# Patient Record
Sex: Male | Born: 1970
Health system: Southern US, Community
[De-identification: ages and names within clinical notes are randomized; demographics above are authoritative.]

---

## 2017-03-22 DIAGNOSIS — J028 Acute pharyngitis due to other specified organisms: Secondary | ICD-10-CM | POA: Diagnosis not present

## 2017-03-22 DIAGNOSIS — B9789 Other viral agents as the cause of diseases classified elsewhere: Secondary | ICD-10-CM | POA: Diagnosis not present

## 2017-03-22 DIAGNOSIS — Z23 Encounter for immunization: Secondary | ICD-10-CM | POA: Diagnosis not present

## 2017-05-22 DIAGNOSIS — Z6822 Body mass index (BMI) 22.0-22.9, adult: Secondary | ICD-10-CM | POA: Diagnosis not present

## 2017-05-22 DIAGNOSIS — E069 Thyroiditis, unspecified: Secondary | ICD-10-CM | POA: Diagnosis not present

## 2017-05-22 DIAGNOSIS — Z1389 Encounter for screening for other disorder: Secondary | ICD-10-CM | POA: Diagnosis not present

## 2017-08-14 DIAGNOSIS — E069 Thyroiditis, unspecified: Secondary | ICD-10-CM | POA: Diagnosis not present

## 2017-08-14 DIAGNOSIS — Z125 Encounter for screening for malignant neoplasm of prostate: Secondary | ICD-10-CM | POA: Diagnosis not present

## 2017-08-14 DIAGNOSIS — Z0000 Encounter for general adult medical examination without abnormal findings: Secondary | ICD-10-CM | POA: Diagnosis not present

## 2017-08-16 DIAGNOSIS — Z1389 Encounter for screening for other disorder: Secondary | ICD-10-CM | POA: Diagnosis not present

## 2017-08-16 DIAGNOSIS — Z0000 Encounter for general adult medical examination without abnormal findings: Secondary | ICD-10-CM | POA: Diagnosis not present

## 2017-08-22 DIAGNOSIS — Z1212 Encounter for screening for malignant neoplasm of rectum: Secondary | ICD-10-CM | POA: Diagnosis not present

## 2018-08-15 DIAGNOSIS — Z125 Encounter for screening for malignant neoplasm of prostate: Secondary | ICD-10-CM | POA: Diagnosis not present

## 2018-08-15 DIAGNOSIS — E069 Thyroiditis, unspecified: Secondary | ICD-10-CM | POA: Diagnosis not present

## 2018-08-17 DIAGNOSIS — E786 Lipoprotein deficiency: Secondary | ICD-10-CM | POA: Diagnosis not present

## 2018-08-17 DIAGNOSIS — Z1331 Encounter for screening for depression: Secondary | ICD-10-CM | POA: Diagnosis not present

## 2018-08-17 DIAGNOSIS — Z0000 Encounter for general adult medical examination without abnormal findings: Secondary | ICD-10-CM | POA: Diagnosis not present

## 2019-01-25 DIAGNOSIS — Z23 Encounter for immunization: Secondary | ICD-10-CM | POA: Diagnosis not present

## 2019-06-03 DIAGNOSIS — Z03818 Encounter for observation for suspected exposure to other biological agents ruled out: Secondary | ICD-10-CM | POA: Diagnosis not present

## 2019-07-11 DIAGNOSIS — M79601 Pain in right arm: Secondary | ICD-10-CM | POA: Diagnosis not present

## 2019-08-23 DIAGNOSIS — Z0000 Encounter for general adult medical examination without abnormal findings: Secondary | ICD-10-CM | POA: Diagnosis not present

## 2019-08-23 DIAGNOSIS — Z125 Encounter for screening for malignant neoplasm of prostate: Secondary | ICD-10-CM | POA: Diagnosis not present

## 2019-08-23 DIAGNOSIS — E786 Lipoprotein deficiency: Secondary | ICD-10-CM | POA: Diagnosis not present

## 2019-08-23 DIAGNOSIS — E069 Thyroiditis, unspecified: Secondary | ICD-10-CM | POA: Diagnosis not present

## 2019-08-28 DIAGNOSIS — Z0000 Encounter for general adult medical examination without abnormal findings: Secondary | ICD-10-CM | POA: Diagnosis not present

## 2019-08-28 DIAGNOSIS — Z1331 Encounter for screening for depression: Secondary | ICD-10-CM | POA: Diagnosis not present

## 2019-09-28 DIAGNOSIS — Z20828 Contact with and (suspected) exposure to other viral communicable diseases: Secondary | ICD-10-CM | POA: Diagnosis not present

## 2019-09-28 DIAGNOSIS — Z03818 Encounter for observation for suspected exposure to other biological agents ruled out: Secondary | ICD-10-CM | POA: Diagnosis not present

## 2019-10-09 DIAGNOSIS — M79601 Pain in right arm: Secondary | ICD-10-CM | POA: Diagnosis not present

## 2019-10-09 DIAGNOSIS — N509 Disorder of male genital organs, unspecified: Secondary | ICD-10-CM | POA: Diagnosis not present

## 2019-10-09 DIAGNOSIS — Z1212 Encounter for screening for malignant neoplasm of rectum: Secondary | ICD-10-CM | POA: Diagnosis not present

## 2019-10-15 ENCOUNTER — Other Ambulatory Visit: Payer: Self-pay | Admitting: Internal Medicine

## 2019-10-15 DIAGNOSIS — M79601 Pain in right arm: Secondary | ICD-10-CM

## 2019-10-15 DIAGNOSIS — N509 Disorder of male genital organs, unspecified: Secondary | ICD-10-CM

## 2019-10-23 ENCOUNTER — Other Ambulatory Visit: Payer: Self-pay

## 2019-10-24 ENCOUNTER — Ambulatory Visit
Admission: RE | Admit: 2019-10-24 | Discharge: 2019-10-24 | Disposition: A | Discharge status: Home / Self Care | Payer: BC Managed Care – PPO | Source: Ambulatory Visit | Attending: Internal Medicine | Admitting: Internal Medicine

## 2019-10-24 DIAGNOSIS — N503 Cyst of epididymis: Secondary | ICD-10-CM | POA: Diagnosis not present

## 2019-10-24 DIAGNOSIS — M79601 Pain in right arm: Secondary | ICD-10-CM | POA: Diagnosis not present

## 2019-10-24 DIAGNOSIS — N509 Disorder of male genital organs, unspecified: Secondary | ICD-10-CM

## 2019-10-30 DIAGNOSIS — D2930 Benign neoplasm of unspecified epididymis: Secondary | ICD-10-CM | POA: Diagnosis not present

## 2019-11-01 ENCOUNTER — Other Ambulatory Visit: Payer: Self-pay | Admitting: Internal Medicine

## 2019-11-01 DIAGNOSIS — M79601 Pain in right arm: Secondary | ICD-10-CM

## 2019-12-30 ENCOUNTER — Other Ambulatory Visit: Payer: BC Managed Care – PPO

## 2020-01-03 ENCOUNTER — Ambulatory Visit
Admission: RE | Admit: 2020-01-03 | Discharge: 2020-01-03 | Disposition: A | Discharge status: Home / Self Care | Payer: BC Managed Care – PPO | Source: Ambulatory Visit | Attending: Internal Medicine | Admitting: Internal Medicine

## 2020-01-03 DIAGNOSIS — R2241 Localized swelling, mass and lump, right lower limb: Secondary | ICD-10-CM | POA: Diagnosis not present

## 2020-01-03 DIAGNOSIS — R2231 Localized swelling, mass and lump, right upper limb: Secondary | ICD-10-CM | POA: Diagnosis not present

## 2020-01-03 DIAGNOSIS — M79601 Pain in right arm: Secondary | ICD-10-CM

## 2020-01-10 ENCOUNTER — Other Ambulatory Visit: Payer: Self-pay | Admitting: Internal Medicine

## 2020-01-10 DIAGNOSIS — R229 Localized swelling, mass and lump, unspecified: Secondary | ICD-10-CM

## 2020-01-31 DIAGNOSIS — D2930 Benign neoplasm of unspecified epididymis: Secondary | ICD-10-CM | POA: Diagnosis not present

## 2020-02-04 ENCOUNTER — Other Ambulatory Visit: Payer: Self-pay

## 2020-02-04 ENCOUNTER — Ambulatory Visit
Admission: RE | Admit: 2020-02-04 | Discharge: 2020-02-04 | Disposition: A | Discharge status: Home / Self Care | Payer: BC Managed Care – PPO | Source: Ambulatory Visit | Attending: Internal Medicine | Admitting: Internal Medicine

## 2020-02-04 DIAGNOSIS — R2231 Localized swelling, mass and lump, right upper limb: Secondary | ICD-10-CM | POA: Diagnosis not present

## 2020-02-04 DIAGNOSIS — R229 Localized swelling, mass and lump, unspecified: Secondary | ICD-10-CM

## 2020-02-04 MED ORDER — GADOBENATE DIMEGLUMINE 529 MG/ML IV SOLN
15.0000 mL | Freq: Once | INTRAVENOUS | Status: AC | PRN
  Administered 2020-02-04: 15 mL via INTRAVENOUS

## 2020-02-12 DIAGNOSIS — R5383 Other fatigue: Secondary | ICD-10-CM | POA: Diagnosis not present

## 2020-02-12 DIAGNOSIS — M79601 Pain in right arm: Secondary | ICD-10-CM | POA: Diagnosis not present

## 2020-02-13 ENCOUNTER — Telehealth: Payer: Self-pay | Admitting: Internal Medicine

## 2020-02-13 NOTE — Telephone Encounter (Signed)
Received a new pt referral from Dr. 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 Nicholas Beck has been cld and scheduled to see Dr. Mickeal Skinner on 10/4 at 12pm. Pt aware to arrive 15 minutes early.

## 2020-02-17 ENCOUNTER — Inpatient Hospital Stay: Payer: BC Managed Care – PPO | Attending: Internal Medicine | Admitting: Internal Medicine

## 2020-02-17 ENCOUNTER — Other Ambulatory Visit: Payer: Self-pay

## 2020-02-17 DIAGNOSIS — D492 Neoplasm of unspecified behavior of bone, soft tissue, and skin: Secondary | ICD-10-CM | POA: Insufficient documentation

## 2020-02-17 NOTE — Progress Notes (Signed)
[facility] [address], [place] [address] [phone]   New Patient Evaluation  Date of Service: 02/17/20 Patient Name: Lionell Matuszak Patient MRN: [id number] Patient DOB: 04/01/1971 Provider: Ventura Sellers, MD  Identifying Statement:  Jaxtin Raimondo is a 49 y.o. male with right arm nerve sheath tumor who presents for initial consultation and evaluation.    Referring Provider: Velna Hatchet, [address],  [place] [place] [id number]   History of Present Illness: The patient's records from the referring physician were obtained and reviewed and the patient interviewed to confirm this HPI.  Javoris Star presents today to review MRI findings from right arm mass.  He describes 1-2 years history of "noticing small mass on back of right arm".  There has never at any time been any pain, weakness, numbness or other focal symptom associated with the mass.  He underwent several rounds of testing including MRI, which demonstrated likely nerve sheath tumor.  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    Medications: No current outpatient medications on file prior to visit.   No current facility-administered medications on file prior to visit.    Allergies: No Known Allergies Past Medical History: No past medical history on file. Past Surgical History:  Social History:  Social History   Socioeconomic History  . Marital status: Married    Spouse name: Not on file  . Number of children: Not on file  . Years of education: Not on file  . Highest education level: Not on file  Occupational History  . Not on file  Tobacco Use  . Smoking status: Not on file  Substance and Sexual Activity  . Alcohol use: Not on file  . Drug use: Not on file  . Sexual activity: Not on file  Other Topics Concern  . Not on file  Social History Narrative  . Not on file   Social Determinants of  Health   Financial Resource Strain:   . Difficulty of Paying Living Expenses: Not on file  Food Insecurity:   . Worried About Charity fundraiser in the Last Year: Not on file  . Ran Out of Food in the Last Year: Not on file  Transportation Needs:   . Lack of Transportation (Medical): Not on file  . Lack of Transportation (Non-Medical): Not on file  Physical Activity:   . Days of Exercise per Week: Not on file  . Minutes of Exercise per Session: Not on file  Stress:   . Feeling of Stress : Not on file  Social Connections:   . Frequency of Communication with Friends and Family: Not on file  . Frequency of Social Gatherings with Friends and Family: Not on file  . Attends Religious Services: Not on file  . Active Member of Clubs or Organizations: Not on file  . Attends Archivist Meetings: Not on file  . Marital Status: Not on file  Intimate Partner Violence:   . Fear of Current or Ex-Partner: Not on file  . Emotionally Abused: Not on file  . Physically Abused: Not on file  . Sexually Abused: Not on file   Family History: No family history on file.  Review of Systems: Constitutional: Doesn't report fevers, chills or abnormal weight loss Eyes: Doesn't report blurriness of vision Ears, nose, mouth, throat, and face: Doesn't report sore throat Respiratory: Doesn't report cough, dyspnea or wheezes Cardiovascular: Doesn't report palpitation,  chest discomfort  Gastrointestinal:  Doesn't report nausea, constipation, diarrhea GU: Doesn't report incontinence Skin: Doesn't report skin rashes Neurological: Per HPI Musculoskeletal: Doesn't report joint pain Behavioral/Psych: Doesn't report anxiety  Physical Exam: Vitals:   02/17/20 1207  BP: (!) 146/87  Pulse: 63  Resp: 18  Temp: (!) 97.1 F (36.2 C)  SpO2: 100%   KPS: 100. General: Alert, cooperative, pleasant, in no acute distress Head: Normal EENT: No conjunctival injection or scleral icterus.  Lungs: Resp  effort normal Cardiac: Regular rate Abdomen: Non-distended abdomen Skin: No rashes cyanosis or petechiae. Extremities: Small firm nodule deep in right triceps  Neurologic Exam: Mental Status: Awake, alert, attentive to examiner. Oriented to self and environment. Language is fluent with intact comprehension.  Cranial Nerves: Visual acuity is grossly normal. Visual fields are full. Extra-ocular movements intact. No ptosis. Face is symmetric Motor: Tone and bulk are normal. Power is full in both arms and legs. Reflexes are symmetric, no pathologic reflexes present.  Sensory: Intact to light touch Gait: Normal.   Labs: I have reviewed the data as listed No results found for: NA, K, CL, CO2, GLUCOSE, BUN, CREATININE, CALCIUM, PROT, ALBUMIN, AST, ALT, ALKPHOS, BILITOT, GFRNONAA, GFRAA No results found for: WBC, NEUTROABS, HGB, HCT, MCV, PLT  Imaging:  MR SHOULDER RIGHT W WO CONTRAST  Result Date: 02/04/2020 CLINICAL DATA:  Palpable mass of the right upper extremity for 2-3 years painful touch. EXAM: MRI OF THE RIGHT HUMERUS WITHOUT AND WITH CONTRAST TECHNIQUE: Multiplanar, multisequence MR imaging of the right humerus was performed before and after the administration of intravenous contrast. CONTRAST:  1mL MULTIHANCE GADOBENATE DIMEGLUMINE 529 MG/ML IV SOLN COMPARISON:  Ultrasound 01/03/2020 FINDINGS: Bones/Joint/Cartilage No acute fracture. No bone marrow edema. No suspicious marrow replacing lesion. Muscles and Tendons Within the lateral head of the right triceps brachii muscle at the level of the mid to distal humeral diaphysis is a well-circumscribed ovoid T1 isointense, T2 heterogeneously hyperintense mass measuring 10 x 6 x 9 mm (series 8, image 20; series 7, image 7). Lesion enhances on postcontrast sequences with a "target" appearance. There is a split-fat appearance of this lesion on sagittal T1 weighted imaging. No adjacent intramuscular edema. No additional lesions. Remaining muscles of  the upper extremity are normal in bulk and signal intensity. No denervation chest changes within the triceps muscle. Intact tendinous structures. Soft tissues No additional soft tissue abnormality. No right axillary lymphadenopathy. IMPRESSION: Well-circumscribed, enhancing 1.0 cm mass within the lateral head of the right triceps brachii muscle, as described above. 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 Electronically Signed   By: Davina Poke D.O.   On: 02/04/2020 15:52     Assessment/Plan Nerve Sheath Tumor  Alvester Eads presents today with clinical and radiographic syndrome consistent with right radial nerve sheath tumor.  Etiology is either schwannoma or neurofibroma.  He has no sequela of neurofibromatosis.  Because of minimal to no symptom burden, small very likely benign mass, we recommended no intervention at this time.    He should obtain a follow up MRI shoulder scan in 1 year to confirm minimal degree of interval growth.     We appreciate the opportunity to participate in the care of Travas Perdew.  He will let us know if has new/recurrent symptoms or notices growth subjectively.  All questions were answered. The patient knows to call the clinic with any problems, questions or concerns. No barriers to learning were detected.  I have spent a total of  45 minutes of face-to-face and non-face-to-face time, excluding clinical staff time, preparing to see patient, ordering tests and/or medications, counseling the patient, and independently interpreting results and communicating results to the patient/family/caregiver    Ventura Sellers, MD Medical Director of Neuro-Oncology [facility] 02/17/20 3:12 PM

## 2020-02-19 ENCOUNTER — Other Ambulatory Visit: Payer: Self-pay | Admitting: *Deleted

## 2020-02-19 ENCOUNTER — Telehealth: Payer: Self-pay | Admitting: Internal Medicine

## 2020-02-19 DIAGNOSIS — D492 Neoplasm of unspecified behavior of bone, soft tissue, and skin: Secondary | ICD-10-CM

## 2020-02-19 NOTE — Telephone Encounter (Signed)
No 10/4 los °

## 2020-02-20 ENCOUNTER — Ambulatory Visit: Payer: BC Managed Care – PPO

## 2020-04-17 ENCOUNTER — Other Ambulatory Visit: Payer: BC Managed Care – PPO

## 2020-04-17 DIAGNOSIS — Z20822 Contact with and (suspected) exposure to covid-19: Secondary | ICD-10-CM

## 2020-04-18 LAB — NOVEL CORONAVIRUS, NAA: SARS-CoV-2, NAA: NOT DETECTED

## 2020-04-18 LAB — SARS-COV-2, NAA 2 DAY TAT

## 2020-09-03 DIAGNOSIS — Z125 Encounter for screening for malignant neoplasm of prostate: Secondary | ICD-10-CM | POA: Diagnosis not present

## 2020-09-03 DIAGNOSIS — E786 Lipoprotein deficiency: Secondary | ICD-10-CM | POA: Diagnosis not present

## 2020-09-07 DIAGNOSIS — Z1212 Encounter for screening for malignant neoplasm of rectum: Secondary | ICD-10-CM | POA: Diagnosis not present

## 2020-09-07 DIAGNOSIS — R82998 Other abnormal findings in urine: Secondary | ICD-10-CM | POA: Diagnosis not present

## 2020-09-07 DIAGNOSIS — Z1339 Encounter for screening examination for other mental health and behavioral disorders: Secondary | ICD-10-CM | POA: Diagnosis not present

## 2020-09-07 DIAGNOSIS — Z0000 Encounter for general adult medical examination without abnormal findings: Secondary | ICD-10-CM | POA: Diagnosis not present

## 2020-09-07 DIAGNOSIS — Z1331 Encounter for screening for depression: Secondary | ICD-10-CM | POA: Diagnosis not present

## 2021-09-13 IMAGING — US US EXTREM LOW*R* LIMITED
1 series · 9 of 9 positions shown · non-contrast
Comparison: None

CLINICAL DATA: Arm pain.

EXAM:
ULTRASOUND RIGHT LOWER EXTREMITY LIMITED
TECHNIQUE: Ultrasound examination of the lower extremity soft tissues was
performed in the area of clinical concern.

[Series 1: us extrem low*right* limited · 0.05mm/px · 9 acquisitions, 9 frames shown]
[im 1/9]
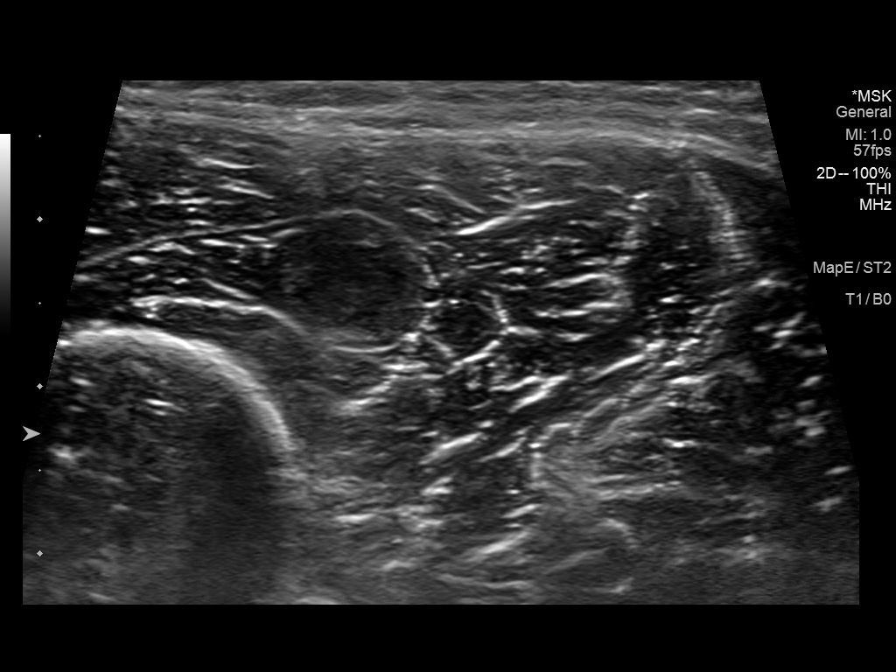
[im 2/9]
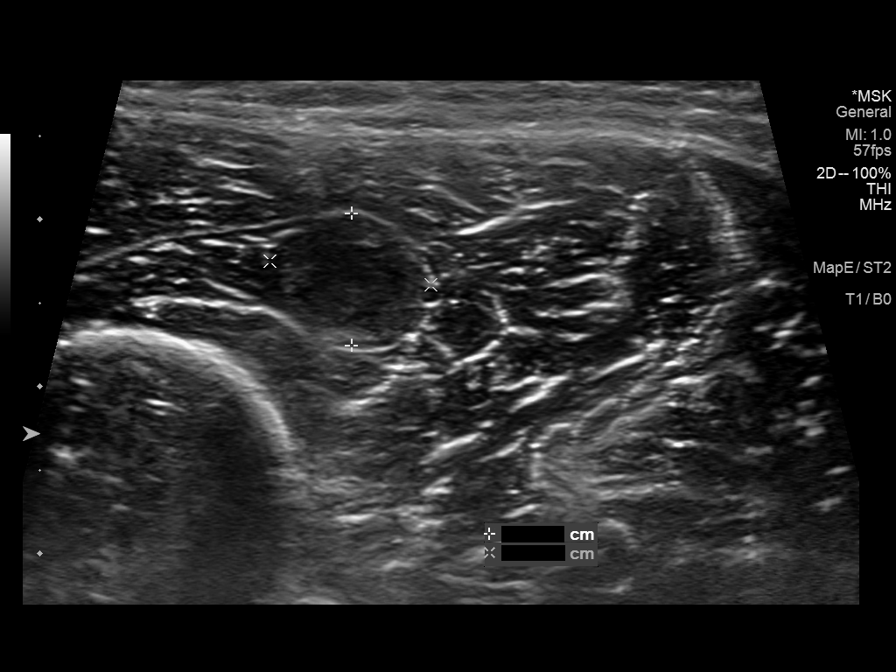
[im 3/9]
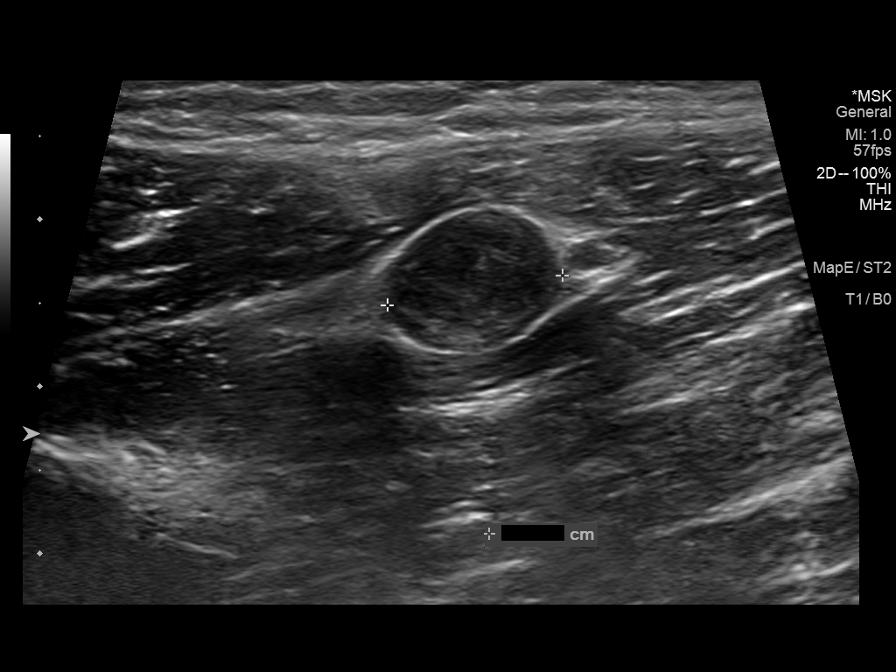
[im 4/9]
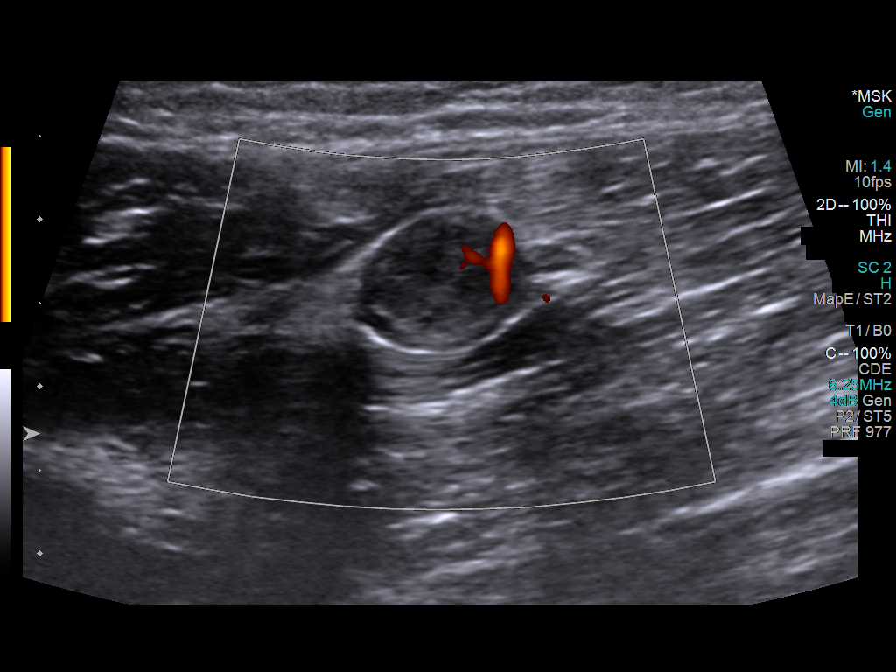
[im 5/9]
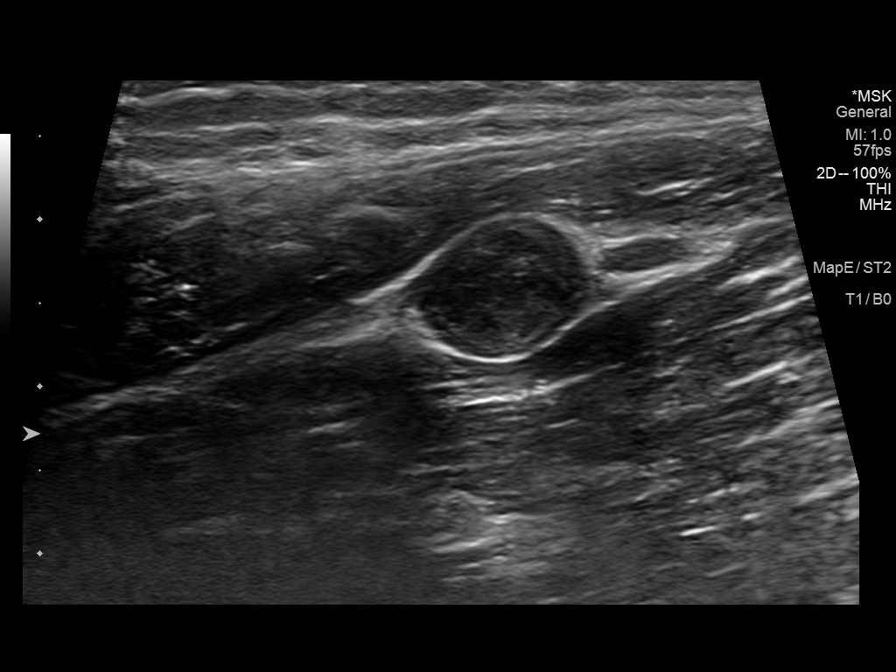
[im 6/9]
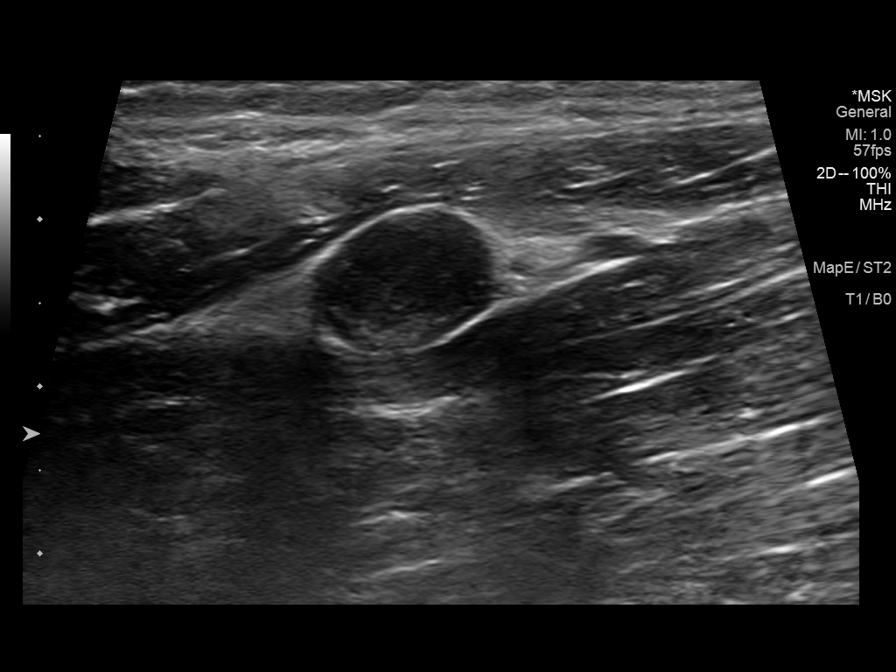
[im 7/9]
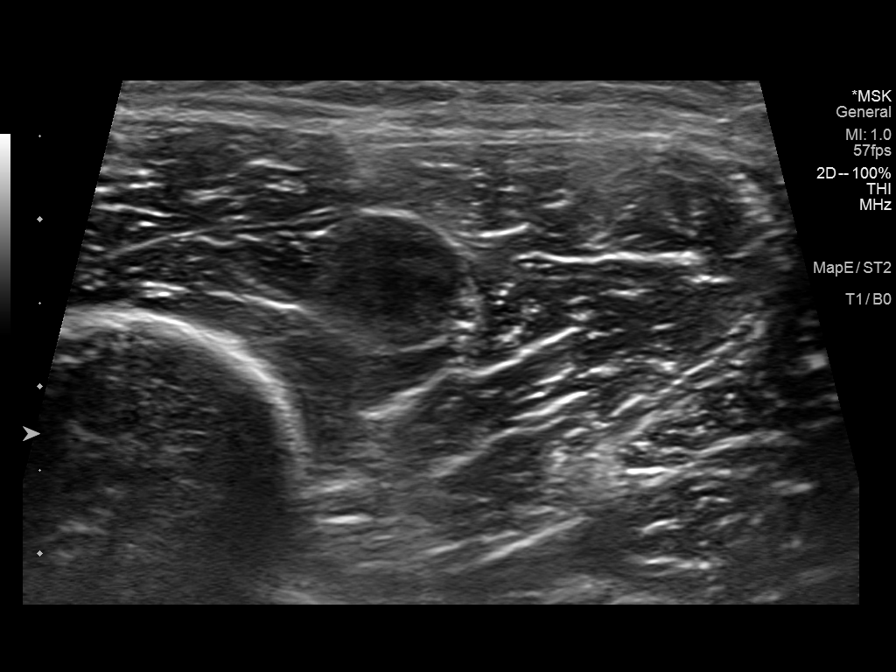
[im 8/9]
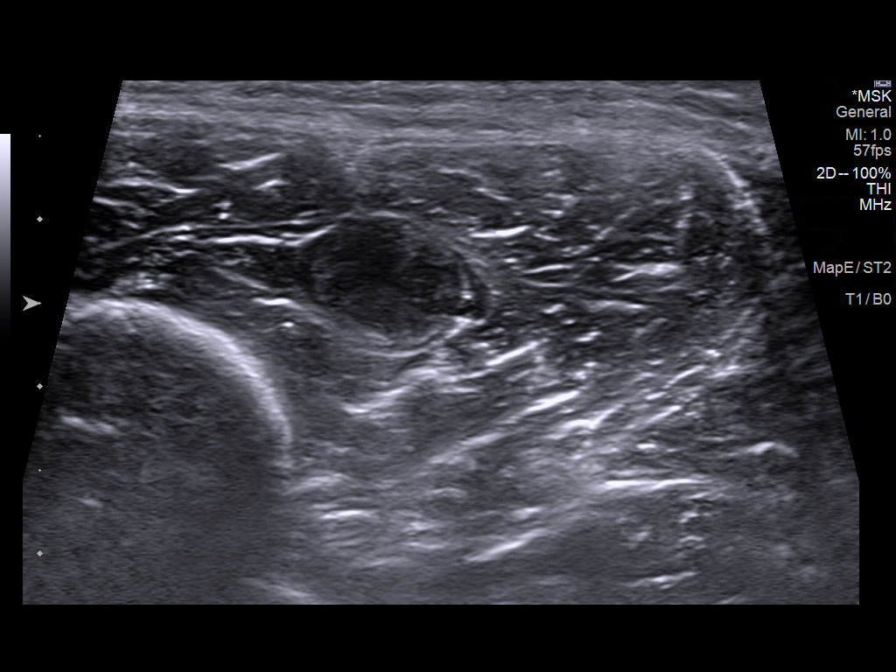
[im 9/9]
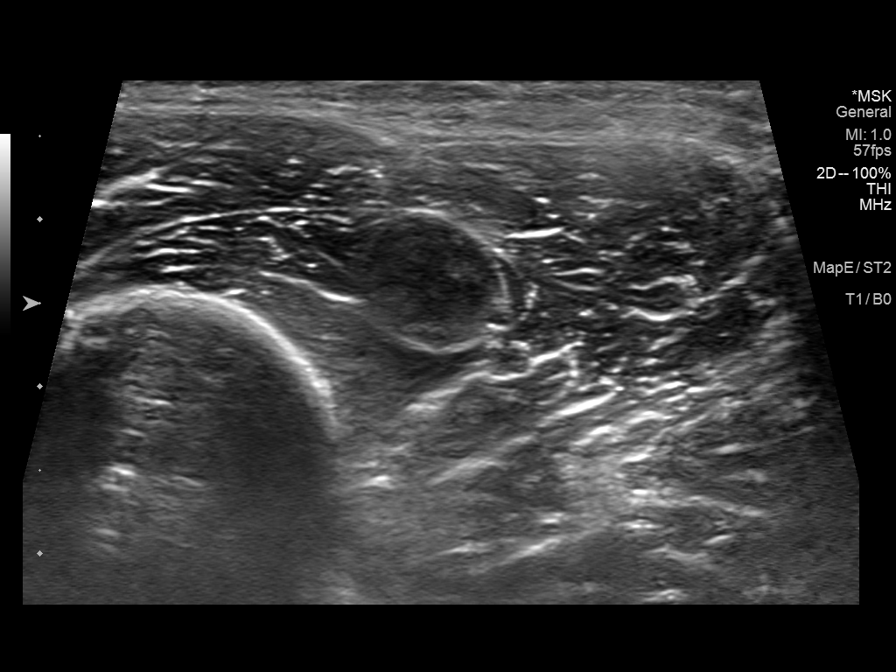

[9 of 9 positions shown; findings below may reference images not displayed]

FINDINGS: The patient's palpable area of concern corresponds to a hypoechoic
0.8 x 1 x 1.1 cm heterogeneous nodule. This nodule appears to be
centered within the subcutaneous fat and demonstrates internal color
Doppler flow. There is no surrounding fluid collection.
IMPRESSION: The patient's palpable area of concern corresponds to an
indeterminate 1.1 cm nodule. This may represent a small lipoma,
however it is vascularity a somewhat atypical as is the presentation
of pain. Other differential considerations include an enlarged lymph
node or hemangioma. Given the presence of pain, consideration should
be given to a follow-up contrast enhanced MRI. Alternatively, a
short interval follow-up ultrasound would be useful to confirm
stability of this nodule.

## 2021-09-13 IMAGING — US US SCROTUM W/ DOPPLER COMPLETE
1 series · 14 of 25 positions shown · non-contrast
Comparison: None.

CLINICAL DATA: Left testicular lump

EXAM:
SCROTAL ULTRASOUND
DOPPLER ULTRASOUND OF THE TESTICLES
TECHNIQUE: Complete ultrasound examination of the testicles, epididymis, and
other scrotal structures was performed. Color and spectral Doppler
ultrasound were also utilized to evaluate blood flow to the
testicles.

[Series 1: us scrotum w/ doppler complete · 0.05mm/px · 14 of 58 slices shown]
[im 1/58]
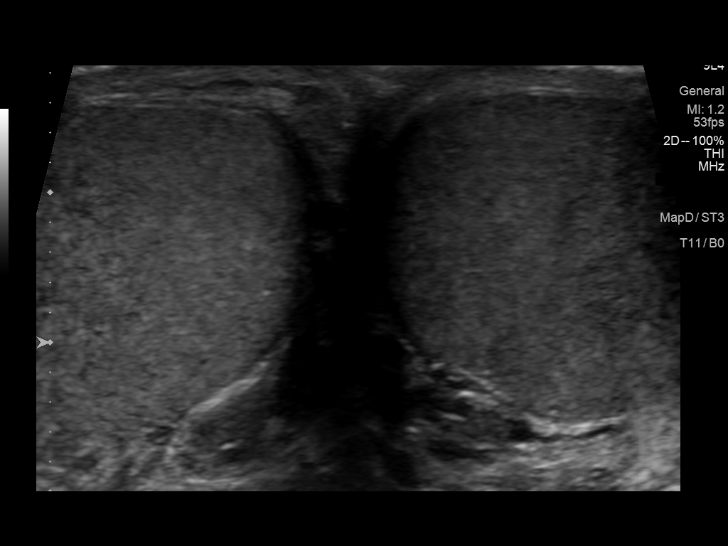
[im 5/58]
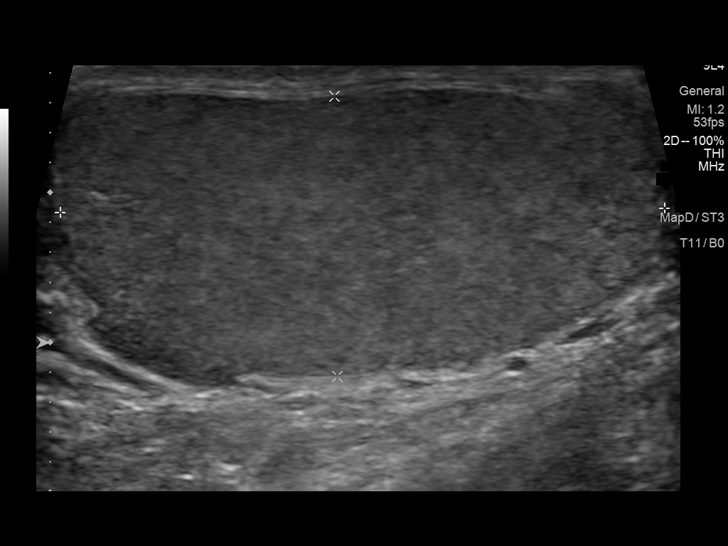
[im 10/58]
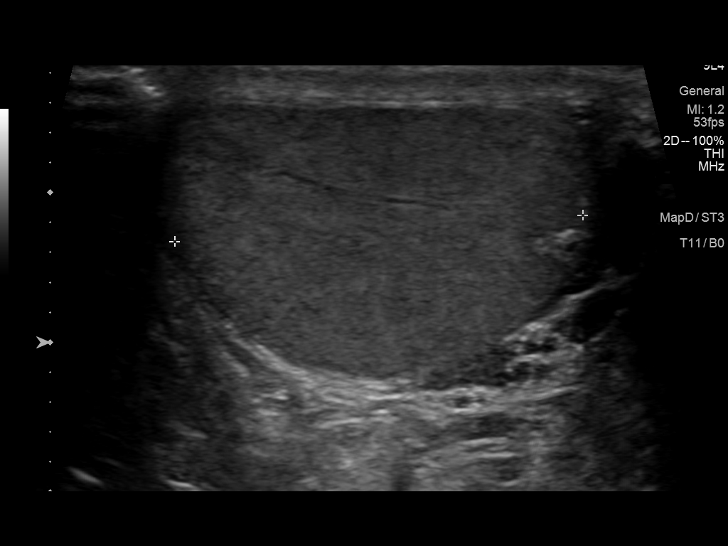
[im 15/58]
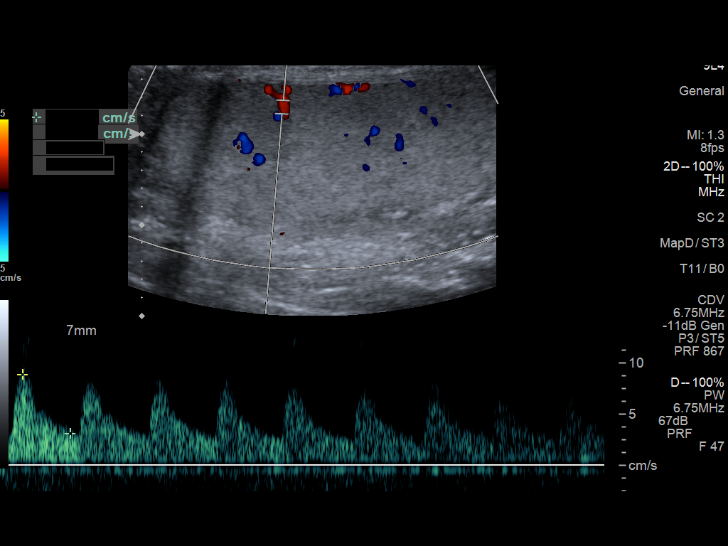
[im 20/58]
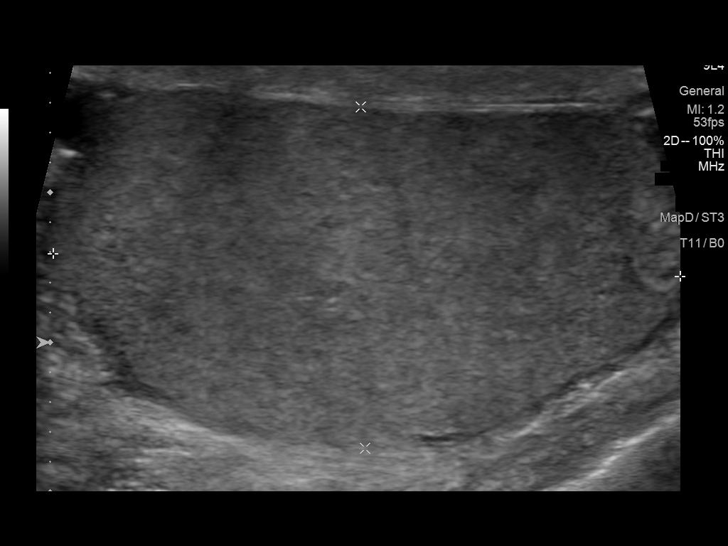
[im 22/58]
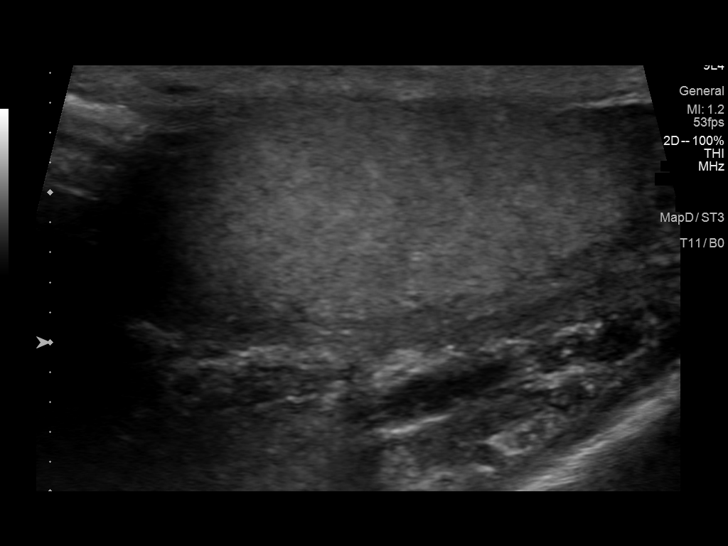
[im 27/58]
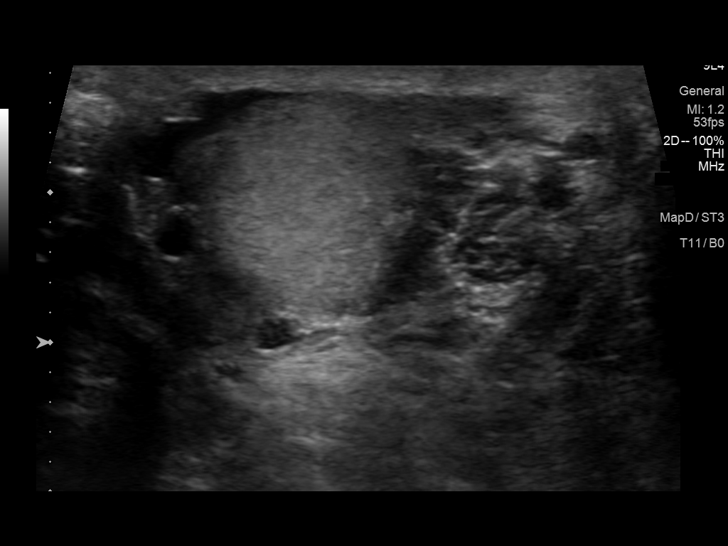
[im 31/58]
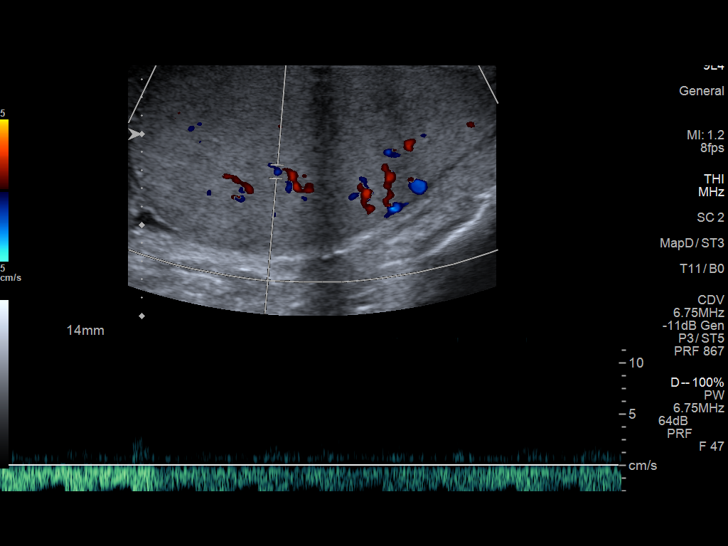
[im 36/58]
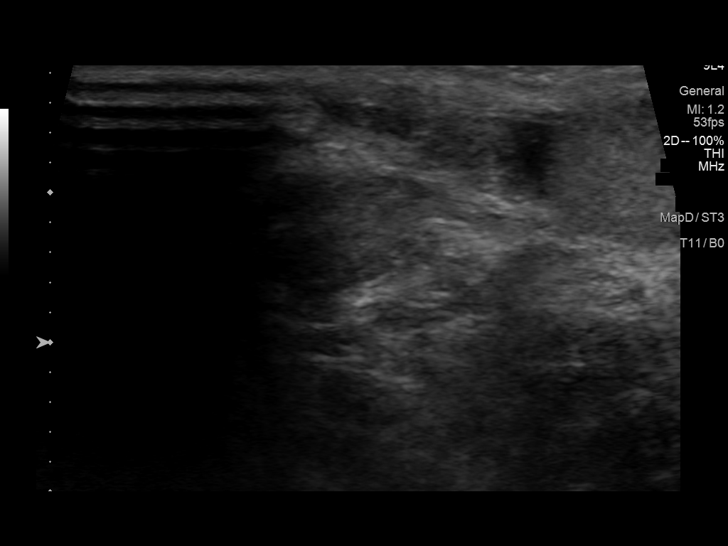
[im 39/58]
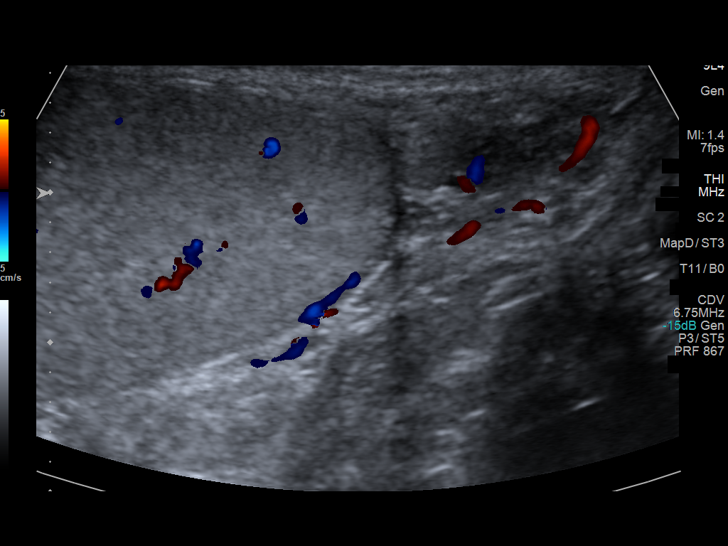
[im 43/58]
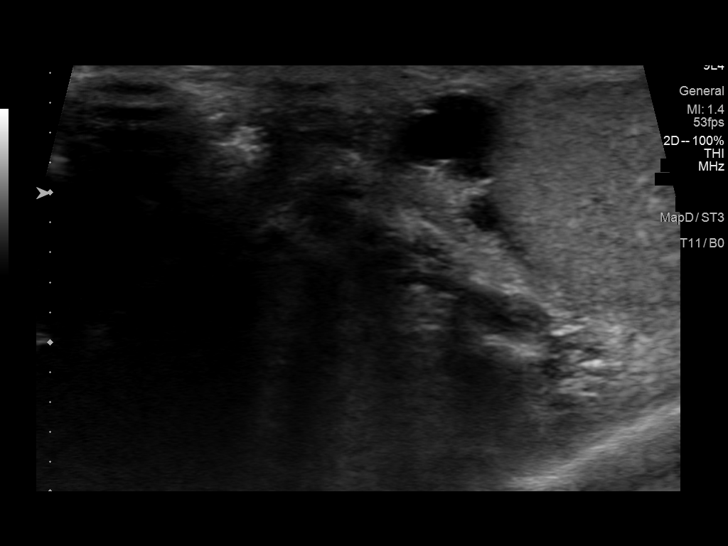
[im 48/58]
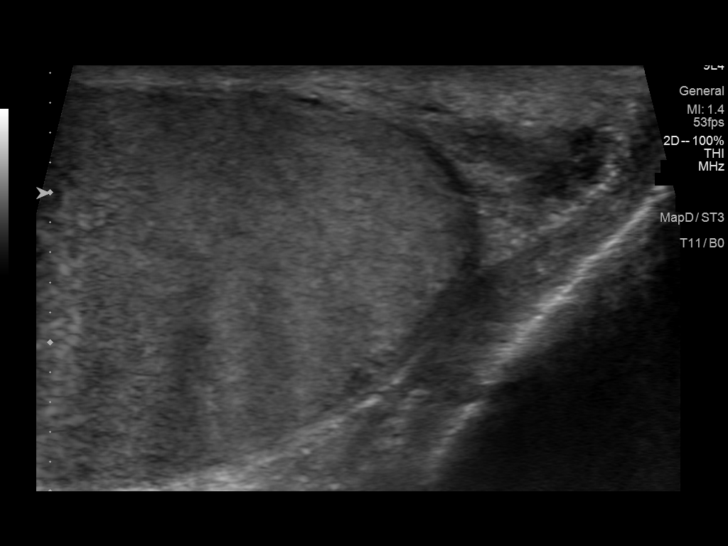
[im 53/58]
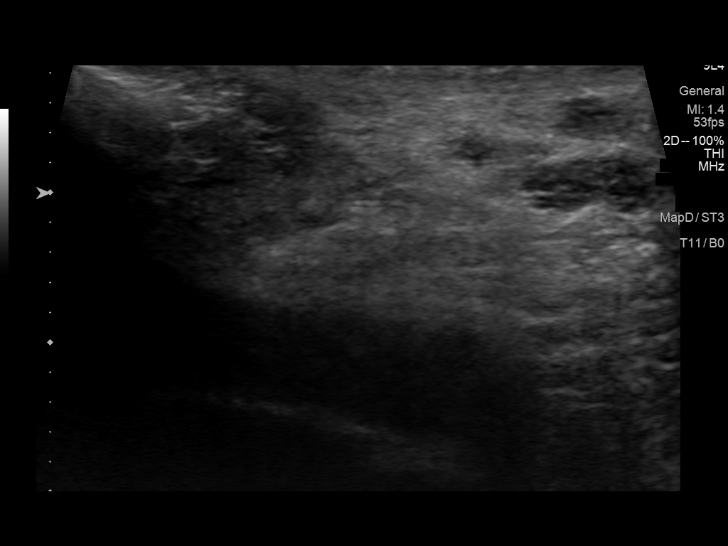
[im 58/58]
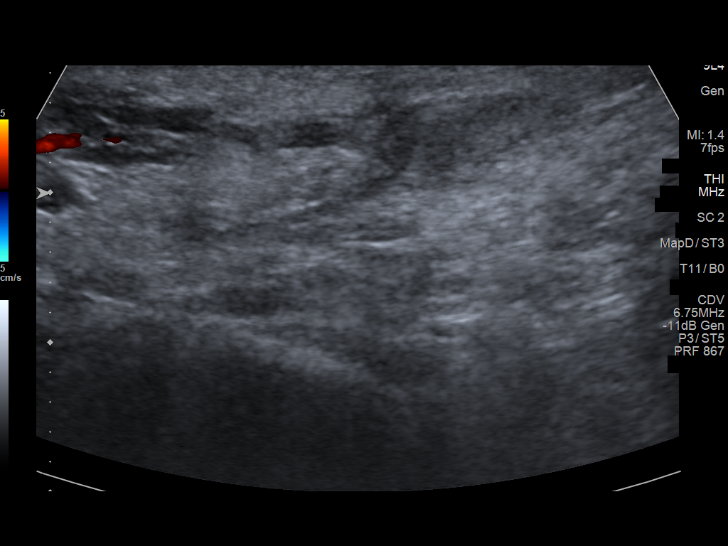

[14 of 25 positions shown; findings below may reference images not displayed]

FINDINGS: Right testicle

Measurements: 4 x 1.9 x 2.7 cm. No mass or microlithiasis
visualized.

Left testicle

Measurements: 4.2 x 2.3 x 2.7 cm. No mass or microlithiasis
visualized.

Right epididymis:  Normal in size and appearance.

Left epididymis: There is a left epididymal head cyst measuring
approximately 8 mm.

Hydrocele:  None visualized.

Varicocele:  None visualized.

Pulsed Doppler interrogation of both testes demonstrates normal low
resistance arterial and venous waveforms bilaterally.
IMPRESSION: 1. No acute abnormality.  No evidence for testicular torsion.
2. There is a left-sided epididymal head cyst measuring up to
approximately 8 mm. This may represent the patient's palpable area
of concern.

## 2021-12-25 IMAGING — MR MR SHOULDER*R* WO/W CM
8 of 9 series · 33 of 40 positions shown · IV contrast (multihance)
Comparison: Ultrasound 01/03/2020

CLINICAL DATA: Palpable mass of the right upper extremity for 2-3
years painful touch.

EXAM:
MRI OF THE RIGHT HUMERUS WITHOUT AND WITH CONTRAST
TECHNIQUE: Multiplanar, multisequence MR imaging of the right humerus was
performed before and after the administration of intravenous
contrast.
CONTRAST:  15mL MULTIHANCE GADOBENATE DIMEGLUMINE 529 MG/ML IV SOLN

[Series 5: T1 · axial · 5.0mm · 0.47mm/px · z∈[-130,+163]mm · 6 of 50 slices shown (1 of 3)]
[im 1/50]
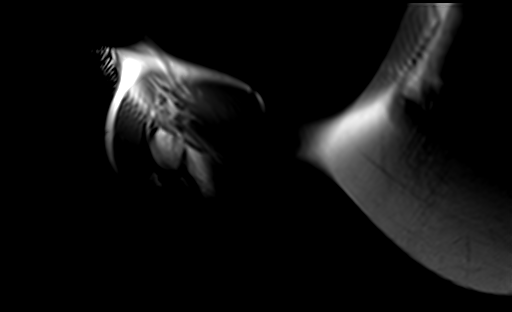
[im 10/50]
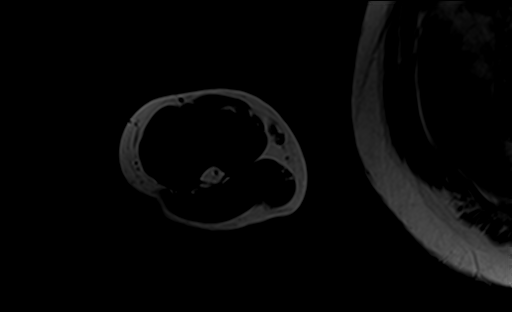
[im 20/50]
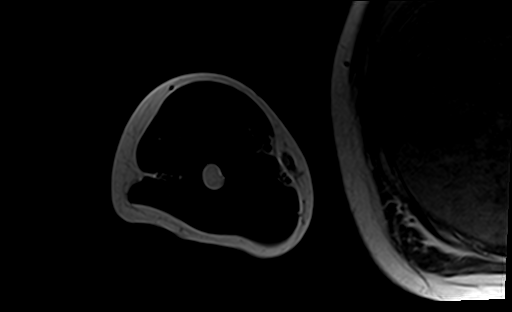
[im 30/50]
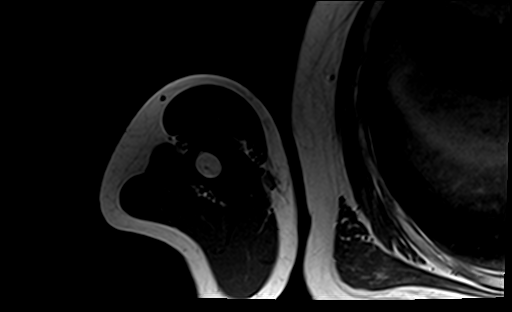
[im 40/50]
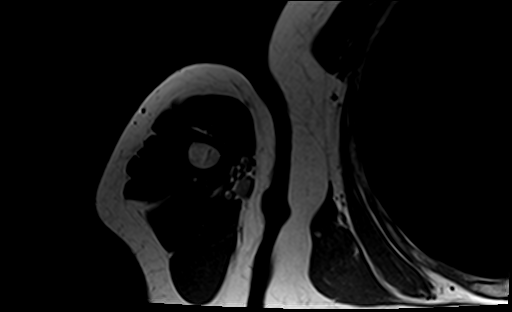
[im 50/50]
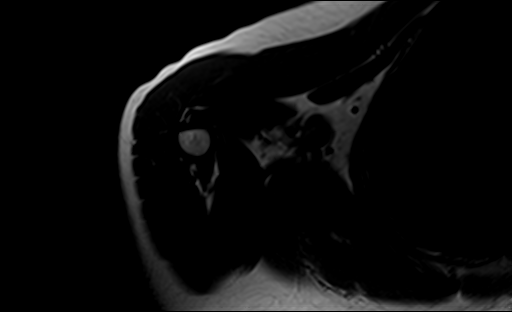

[Series 6: T1 · coronal · 4.0mm · 0.59mm/px · 2 of 21 slices shown (2 of 3)]
[im 1/21]
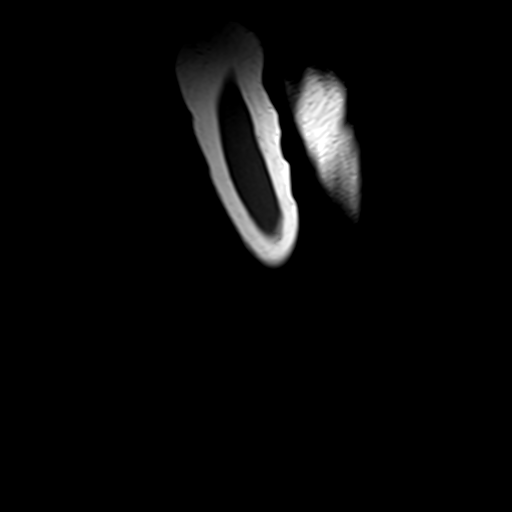
[im 21/21]
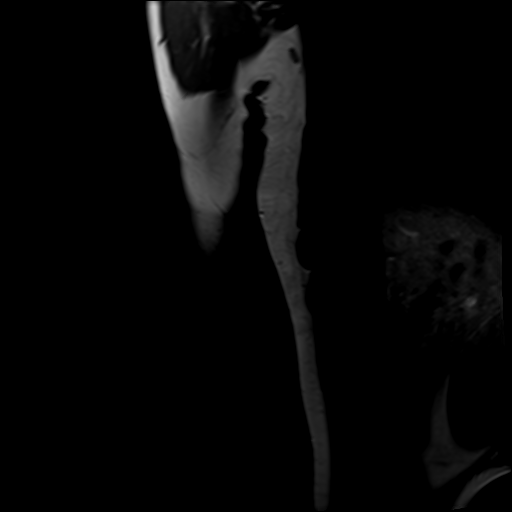

[Series 7: T2 fat-sat · coronal · 4.0mm · 1.17mm/px · 2 of 21 slices shown (1 of 2)]
[im 1/21]
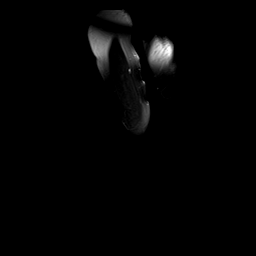
[im 21/21]
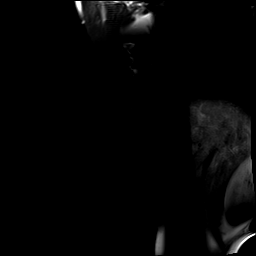

[Series 8: T2 fat-sat · axial · 5.0mm · 0.47mm/px · z∈[-130,+163]mm · 7 of 50 slices shown (2 of 2)]
[im 1/50]
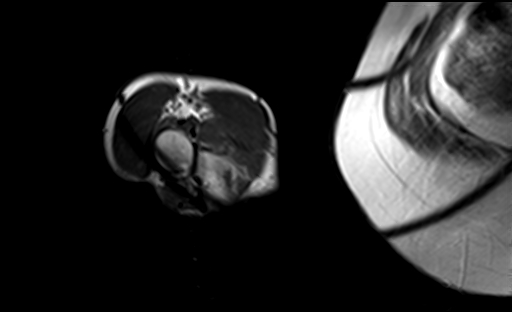
[im 9/50]
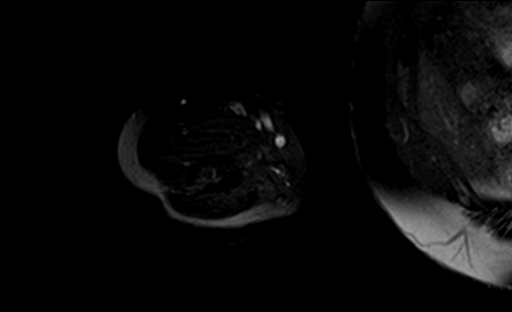
[im 17/50]
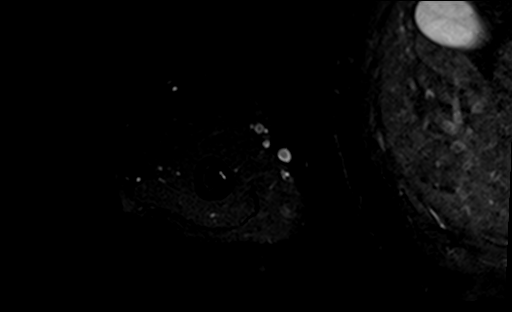
[im 25/50]
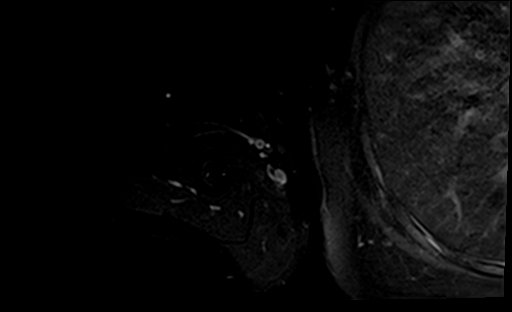
[im 33/50]
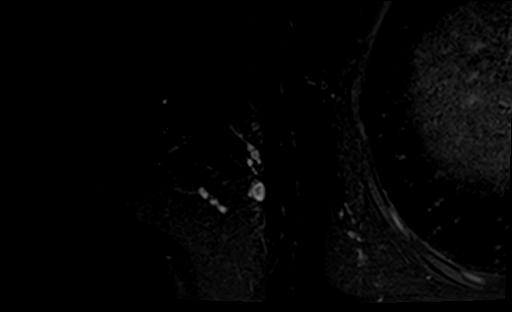
[im 41/50]
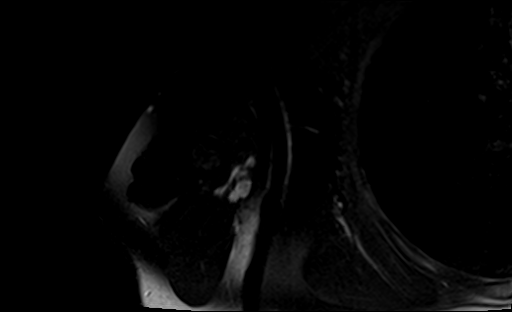
[im 50/50]
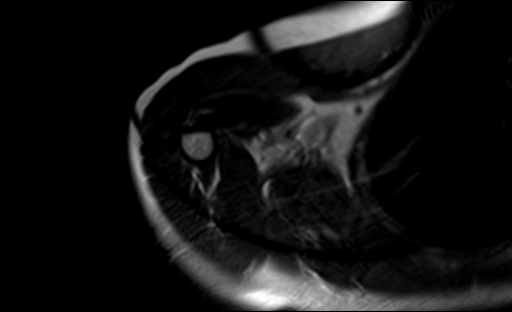

[Series 9: STIR · sagittal · 4.0mm · 0.94mm/px · 3 of 21 slices shown]
[im 1/21]
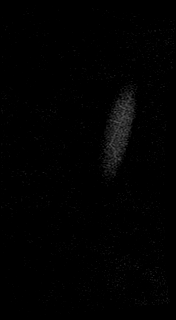
[im 11/21]
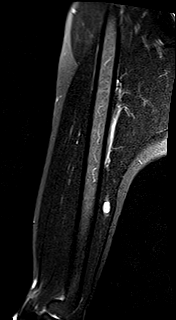
[im 21/21]
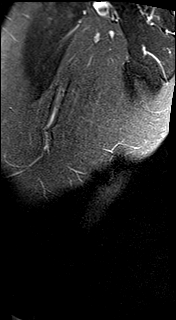

[Series 10: T1 · sagittal · 4.0mm · 0.94mm/px · 3 of 21 slices shown (3 of 3)]
[im 1/21]
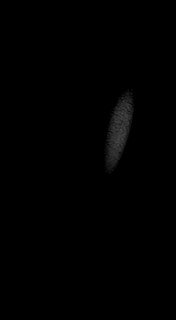
[im 11/21]
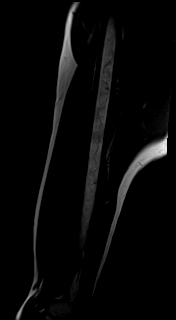
[im 21/21]
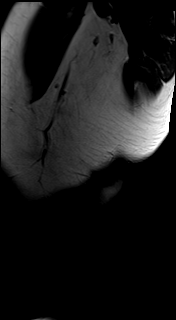

[Series 11: pre fs axial · axial · non-contrast · 5.0mm · 0.47mm/px · z∈[-131,+163]mm · 7 of 50 slices shown]
[im 1/50]
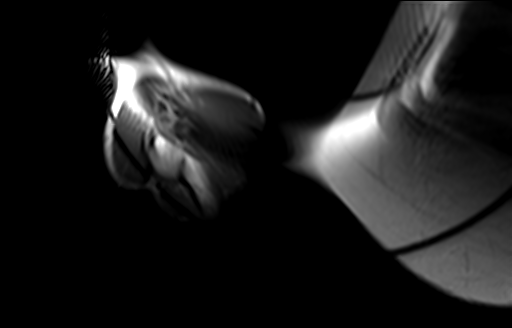
[im 9/50]
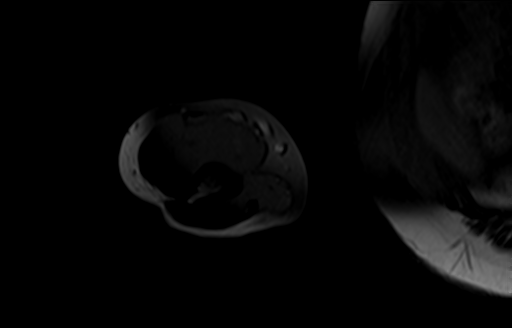
[im 17/50]
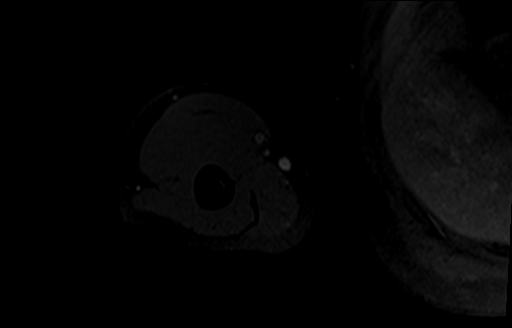
[im 25/50]
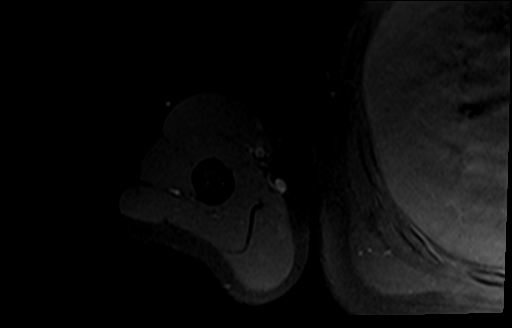
[im 33/50]
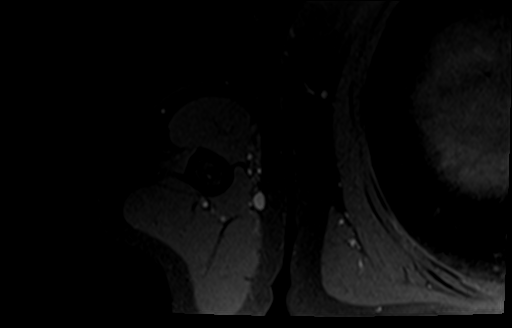
[im 41/50]
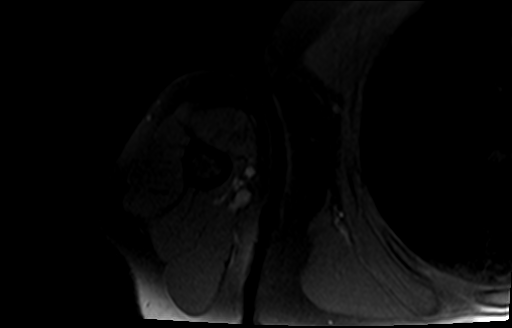
[im 50/50]
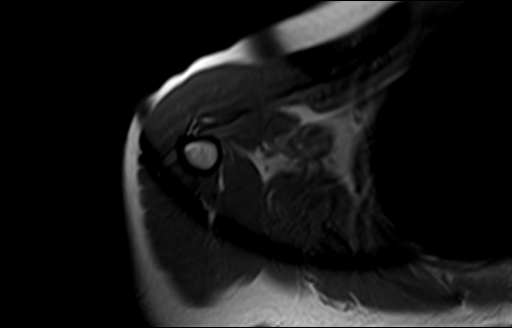

[Series 12: post fs axial · axial · 5.0mm · 0.47mm/px · z∈[-131,-35]mm · 3 of 50 slices shown]
[im 1/50]
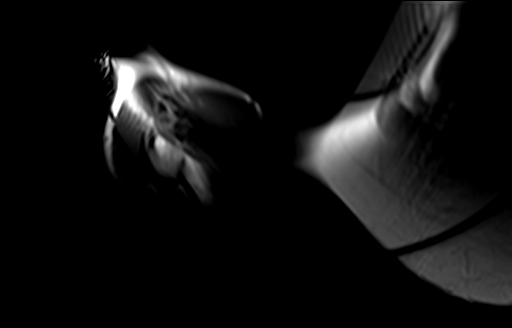
[im 9/50]
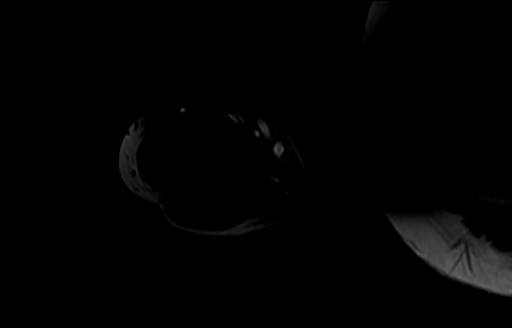
[im 17/50]
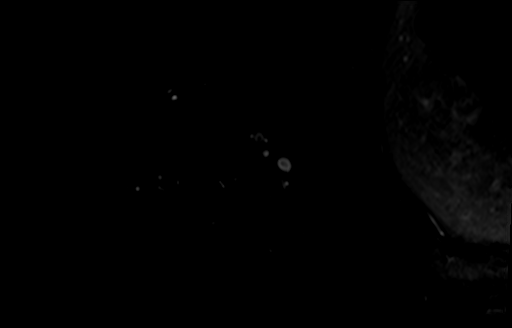

[33 of 40 positions shown; findings below may reference images not displayed]

FINDINGS: Bones/Joint/Cartilage

No acute fracture. No bone marrow edema. No suspicious marrow
replacing lesion.

Muscles and Tendons

Within the lateral head of the right triceps brachii muscle at the
level of the mid to distal humeral diaphysis is a well-circumscribed
ovoid T1 isointense, T2 heterogeneously hyperintense mass measuring
10 x 6 x 9 mm (series 8, image 20; series 7, image 7). Lesion
enhances on postcontrast sequences with a "target" appearance. There
is a split-fat appearance of this lesion on sagittal T1 weighted
imaging. No adjacent intramuscular edema. No additional lesions.

Remaining muscles of the upper extremity are normal in bulk and
signal intensity. No denervation chest changes within the triceps
muscle. Intact tendinous structures.

Soft tissues

No additional soft tissue abnormality. No right axillary
lymphadenopathy.
IMPRESSION: Well-circumscribed, enhancing 1.0 cm mass within the lateral head of
the right triceps brachii muscle, as described above. Imaging
features are most compatible with a peripheral nerve sheath tumor.
